# Patient Record
Sex: Female | Born: 2000 | Race: Black or African American | Hispanic: No | Marital: Single | Smoking: Former smoker
Health system: Southern US, Community
[De-identification: ages and names within clinical notes are randomized; demographics above are authoritative.]

## PROBLEM LIST (undated history)

## (undated) DIAGNOSIS — J020 Streptococcal pharyngitis: Secondary | ICD-10-CM

## (undated) DIAGNOSIS — F419 Anxiety disorder, unspecified: Secondary | ICD-10-CM

## (undated) DIAGNOSIS — T7840XA Allergy, unspecified, initial encounter: Secondary | ICD-10-CM

## (undated) DIAGNOSIS — F3481 Disruptive mood dysregulation disorder: Secondary | ICD-10-CM

## (undated) DIAGNOSIS — F1220 Cannabis dependence, uncomplicated: Secondary | ICD-10-CM

## (undated) DIAGNOSIS — F319 Bipolar disorder, unspecified: Secondary | ICD-10-CM

---
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
## 2018-02-02 ENCOUNTER — Encounter (HOSPITAL_COMMUNITY): Payer: Self-pay

## 2018-02-02 ENCOUNTER — Other Ambulatory Visit: Payer: Self-pay

## 2018-02-02 ENCOUNTER — Emergency Department (HOSPITAL_COMMUNITY)
Admission: EM | Admit: 2018-02-02 | Discharge: 2018-02-02 | Disposition: A | Discharge status: Home / Self Care | Payer: Self-pay | Attending: Emergency Medicine | Admitting: Emergency Medicine

## 2018-02-02 DIAGNOSIS — Z79899 Other long term (current) drug therapy: Secondary | ICD-10-CM | POA: Insufficient documentation

## 2018-02-02 DIAGNOSIS — J45909 Unspecified asthma, uncomplicated: Secondary | ICD-10-CM | POA: Insufficient documentation

## 2018-02-02 DIAGNOSIS — Z3202 Encounter for pregnancy test, result negative: Secondary | ICD-10-CM | POA: Insufficient documentation

## 2018-02-02 DIAGNOSIS — N946 Dysmenorrhea, unspecified: Secondary | ICD-10-CM | POA: Insufficient documentation

## 2018-02-02 DIAGNOSIS — F17210 Nicotine dependence, cigarettes, uncomplicated: Secondary | ICD-10-CM | POA: Insufficient documentation

## 2018-02-02 LAB — URINALYSIS, ROUTINE W REFLEX MICROSCOPIC
BACTERIA UA: NONE SEEN
Bilirubin Urine: NEGATIVE
Glucose, UA: NEGATIVE mg/dL
Ketones, ur: 20 mg/dL — AB
Leukocytes, UA: NEGATIVE
Nitrite: NEGATIVE
PROTEIN: NEGATIVE mg/dL
Specific Gravity, Urine: 1.015 (ref 1.005–1.030)
pH: 5 (ref 5.0–8.0)

## 2018-02-02 LAB — PREGNANCY, URINE: PREG TEST UR: NEGATIVE

## 2018-02-02 MED ORDER — IBUPROFEN 400 MG PO TABS
400.0000 mg | ORAL_TABLET | Freq: Once | ORAL | Status: AC
  Administered 2018-02-02: 400 mg via ORAL
  Filled 2018-02-02: qty 1

## 2018-02-02 MED ORDER — ACETAMINOPHEN 500 MG PO TABS
1000.0000 mg | ORAL_TABLET | Freq: Once | ORAL | Status: AC
  Administered 2018-02-02: 1000 mg via ORAL
  Filled 2018-02-02: qty 2

## 2018-02-02 NOTE — Discharge Instructions (Signed)
You may take 600 mg of ibuprofen and 1000 mg of Tylenol every 6 hours as needed for pain.  If your symptoms worsen, you develop fever, persistent vomiting or have any concerns, return immediately to the emergency department.

## 2018-02-02 NOTE — ED Provider Notes (Signed)
[facility] EMERGENCY DEPARTMENT Provider Note   CSN: [id number] Arrival date & time: 02/02/18  1003     History   Chief Complaint Chief Complaint  Patient presents with  . Abdominal Cramping    HPI Brooke Kelly is a 17 y.o. female.  HPI Patient presents with lower abdominal cramping which radiates to her low back.  This started yesterday.  Also started having vaginal bleeding.  States this is the normal time for her to start her menstrual..  Her last one was 1 month ago.  States that her cramping pain feels like menstrual cramps but more intense in nature.  She denies any dysuria, frequency or urgency.  No other vaginal complaints.  Denies fever or chills.  No nausea, vomiting or diarrhea.  She has not tried taking anything prior to coming to the emergency department.  She has been using heating pad with minimal relief. Past Medical History:  Diagnosis Date  . Allergy   . Anxiety   . Asthma   . Bipolar and related disorder (HCC) 05/18/2016  . Cannabis use disorder, moderate, dependence (HCC) 05/18/2016  . DMDD (disruptive mood dysregulation disorder) (HCC) 05/18/2016  . Strep pharyngitis     Patient Active Problem List   Diagnosis Date Noted  . DMDD (disruptive mood dysregulation disorder) (HCC) 05/18/2016  . Cannabis use disorder, moderate, dependence (HCC) 05/18/2016    History reviewed. No pertinent surgical history.   OB History   None      Home Medications    Prior to Admission medications   Medication Sig Start Date End Date Taking? Authorizing Provider  acetaminophen (TYLENOL) 500 MG tablet Take 1,000 mg by mouth every 6 (six) hours as needed.    [provider]  albuterol (PROVENTIL HFA;VENTOLIN HFA) 108 (90 Base) MCG/ACT inhaler Inhale 2 puffs into the lungs every 6 (six) hours as needed for wheezing or shortness of breath. 06/07/16   Loren Racer, MD  ARIPiprazole (ABILIFY) 5 MG tablet Take 1 tablet (5 mg total) by mouth at bedtime. 05/24/16    Denzil Magnuson, NP  cyclobenzaprine (FLEXERIL) 5 MG tablet Take 1 tablet (5 mg total) by mouth 3 (three) times daily as needed for muscle spasms. 01/30/17   Khatri, Hina, PA-C  fluticasone (FLONASE) 50 MCG/ACT nasal spray Place 2 sprays into both nostrils daily. 06/07/16   Loren Racer, MD  hydrOXYzine (ATARAX/VISTARIL) 50 MG tablet Take 1 tablet (50 mg total) by mouth at bedtime as needed (insomnia). 05/24/16   Denzil Magnuson, NP  ibuprofen (ADVIL,MOTRIN) 600 MG tablet Take 1 tablet (600 mg total) by mouth 4 (four) times daily. 11/24/16   Ivery Quale, PA-C  lidocaine (LIDODERM) 5 % Place 1 patch onto the skin daily. Remove & Discard patch within 12 hours or as directed by MD 01/30/17   Dietrich Pates, PA-C  loratadine (CLARITIN) 10 MG tablet Take 1 tablet (10 mg total) by mouth daily. 06/07/16   Loren Racer, MD  naproxen (NAPROSYN) 500 MG tablet Take 1 tablet (500 mg total) by mouth 2 (two) times daily. 01/30/17   Dietrich Pates, PA-C    Family History Family History  Problem Relation Age of Onset  . Hypertension Mother     Social History Social History   Tobacco Use  . Smoking status: Current Every Day Smoker    Packs/day: 0.50    Types: Cigarettes  . Smokeless tobacco: Never Used  Substance Use Topics  . Alcohol use: No  . Drug use: Yes    Frequency:  1.0 times per week    Types: Marijuana     Allergies   Other   Review of Systems Review of Systems  Constitutional: Negative for chills and fever.  Gastrointestinal: Positive for abdominal pain. Negative for constipation, diarrhea, nausea and vomiting.  Genitourinary: Positive for pelvic pain and vaginal bleeding. Negative for dysuria, flank pain, frequency, hematuria and vaginal discharge.  Musculoskeletal: Positive for back pain and myalgias.  Skin: Negative for rash and wound.  Neurological: Negative for dizziness, weakness, light-headedness, numbness and headaches.  All other systems reviewed and are  negative.    Physical Exam Updated Vital Signs BP 140/84 (BP Location: Right Arm)   Pulse (!) 59   Temp 98.3 F (36.8 C)   Resp 12   Wt 70.3 kg   LMP 02/01/2018   SpO2 99%   BMI 25.79 kg/m   Physical Exam  Constitutional: She is oriented to person, place, and time. She appears well-developed and well-nourished.  HENT:  Head: Normocephalic and atraumatic.  Eyes: Pupils are equal, round, and reactive to light. EOM are normal.  Neck: Normal range of motion. Neck supple.  Cardiovascular: Normal rate and regular rhythm. Exam reveals no gallop and no friction rub.  No murmur heard. Pulmonary/Chest: Effort normal and breath sounds normal. No stridor. No respiratory distress. She has no wheezes. She has no rales. She exhibits no tenderness.  Abdominal: Soft. Bowel sounds are normal. She exhibits no mass. There is tenderness. There is no rebound and no guarding.  Mild suprapubic tenderness to palpation.  No rebound or guarding.  No obvious masses  Musculoskeletal: Normal range of motion. She exhibits no edema or tenderness.  No midline thoracic or lumbar tenderness.  No CVA tenderness.  Neurological: She is alert and oriented to person, place, and time.  Moving all extremities without focal deficit.  Sensation fully intact.  Skin: Skin is warm and dry. No rash noted. No erythema.  Psychiatric: She has a normal mood and affect. Her behavior is normal.  Nursing note and vitals reviewed.    ED Treatments / Results  Labs (all labs ordered are listed, but only abnormal results are displayed) Labs Reviewed  URINALYSIS, ROUTINE W REFLEX MICROSCOPIC - Abnormal; Notable for the following components:      Result Value   Hgb urine dipstick SMALL (*)    Ketones, ur 20 (*)    All other components within normal limits  PREGNANCY, URINE    EKG None  Radiology No results found.  Procedures Procedures (including critical care time)  Medications Ordered in ED Medications   acetaminophen (TYLENOL) tablet 1,000 mg (has no administration in time range)  ibuprofen (ADVIL,MOTRIN) tablet 400 mg (has no administration in time range)     Initial Impression / Assessment and Plan / ED Course  I have reviewed the triage vital signs and the nursing notes.  Pertinent labs & imaging results that were available during my care of the patient were reviewed by me and considered in my medical decision making (see chart for details).    Abdominal exam is benign.  UA and hCG are negative.  Will treat symptomatically.  Return precautions given.   Final Clinical Impressions(s) / ED Diagnoses   Final diagnoses:  Dysmenorrhea    ED Discharge Orders    None       Loren Racer, MD 02/02/18 1253

## 2018-02-02 NOTE — ED Triage Notes (Signed)
Pt reports that she started her period yesterday and is having painful cramps. Flow is heavy.pt has tried motrin without any relief

## 2018-03-12 ENCOUNTER — Encounter (HOSPITAL_COMMUNITY): Payer: Self-pay | Admitting: *Deleted

## 2018-03-12 ENCOUNTER — Other Ambulatory Visit: Payer: Self-pay

## 2018-03-12 ENCOUNTER — Emergency Department (HOSPITAL_COMMUNITY)
Admission: EM | Admit: 2018-03-12 | Discharge: 2018-03-12 | Disposition: A | Discharge status: Home / Self Care | Payer: Self-pay | Attending: Emergency Medicine | Admitting: Emergency Medicine

## 2018-03-12 DIAGNOSIS — R69 Illness, unspecified: Secondary | ICD-10-CM

## 2018-03-12 DIAGNOSIS — J45909 Unspecified asthma, uncomplicated: Secondary | ICD-10-CM | POA: Insufficient documentation

## 2018-03-12 DIAGNOSIS — F17210 Nicotine dependence, cigarettes, uncomplicated: Secondary | ICD-10-CM | POA: Insufficient documentation

## 2018-03-12 DIAGNOSIS — Z79899 Other long term (current) drug therapy: Secondary | ICD-10-CM | POA: Insufficient documentation

## 2018-03-12 DIAGNOSIS — J111 Influenza due to unidentified influenza virus with other respiratory manifestations: Secondary | ICD-10-CM | POA: Insufficient documentation

## 2018-03-12 MED ORDER — BENZONATATE 100 MG PO CAPS: 100.0000 mg | ORAL_CAPSULE | Freq: Three times a day (TID) | ORAL | 0 refills | Status: AC

## 2018-03-12 MED ORDER — IBUPROFEN 600 MG PO TABS: 600.0000 mg | ORAL_TABLET | Freq: Four times a day (QID) | ORAL | 0 refills | Status: AC | PRN

## 2018-03-12 NOTE — ED Provider Notes (Signed)
[facility] EMERGENCY DEPARTMENT Provider Note   CSN: [id number] Arrival date & time: 03/12/18  1501     History   Chief Complaint Chief Complaint  Patient presents with  . Cough    HPI Brooke Kelly is a 17 y.o. female.  The history is provided by the patient. No language interpreter was used.  Cough  This is a new problem. The current episode started more than 2 days ago. The problem occurs constantly. The problem has been gradually worsening. The cough is non-productive. The maximum temperature recorded prior to her arrival was 100 to 100.9 F. Pertinent negatives include no chest pain. She has tried nothing for the symptoms. The treatment provided no relief.    Past Medical History:  Diagnosis Date  . Allergy   . Anxiety   . Asthma   . Bipolar and related disorder (HCC) 05/18/2016  . Cannabis use disorder, moderate, dependence (HCC) 05/18/2016  . DMDD (disruptive mood dysregulation disorder) (HCC) 05/18/2016  . Strep pharyngitis     Patient Active Problem List   Diagnosis Date Noted  . DMDD (disruptive mood dysregulation disorder) (HCC) 05/18/2016  . Cannabis use disorder, moderate, dependence (HCC) 05/18/2016    History reviewed. No pertinent surgical history.   OB History   None      Home Medications    Prior to Admission medications   Medication Sig Start Date End Date Taking? Authorizing Provider  acetaminophen (TYLENOL) 500 MG tablet Take 1,000 mg by mouth every 6 (six) hours as needed.    [provider]  albuterol (PROVENTIL HFA;VENTOLIN HFA) 108 (90 Base) MCG/ACT inhaler Inhale 2 puffs into the lungs every 6 (six) hours as needed for wheezing or shortness of breath. 06/07/16   Loren RacerYelverton, David, MD  ARIPiprazole (ABILIFY) 5 MG tablet Take 1 tablet (5 mg total) by mouth at bedtime. 05/24/16   Denzil Magnusonhomas, Lashunda, NP  cyclobenzaprine (FLEXERIL) 5 MG tablet Take 1 tablet (5 mg total) by mouth 3 (three) times daily as needed for muscle spasms. 01/30/17    Khatri, Hina, PA-C  fluticasone (FLONASE) 50 MCG/ACT nasal spray Place 2 sprays into both nostrils daily. 06/07/16   Loren RacerYelverton, David, MD  hydrOXYzine (ATARAX/VISTARIL) 50 MG tablet Take 1 tablet (50 mg total) by mouth at bedtime as needed (insomnia). 05/24/16   Denzil Magnusonhomas, Lashunda, NP  ibuprofen (ADVIL,MOTRIN) 600 MG tablet Take 1 tablet (600 mg total) by mouth 4 (four) times daily. 11/24/16   Ivery QualeBryant, Hobson, PA-C  lidocaine (LIDODERM) 5 % Place 1 patch onto the skin daily. Remove & Discard patch within 12 hours or as directed by MD 01/30/17   Dietrich PatesKhatri, Hina, PA-C  loratadine (CLARITIN) 10 MG tablet Take 1 tablet (10 mg total) by mouth daily. 06/07/16   Loren RacerYelverton, David, MD  naproxen (NAPROSYN) 500 MG tablet Take 1 tablet (500 mg total) by mouth 2 (two) times daily. 01/30/17   Dietrich PatesKhatri, Hina, PA-C    Family History Family History  Problem Relation Age of Onset  . Hypertension Mother     Social History Social History   Tobacco Use  . Smoking status: Current Every Day Smoker    Packs/day: 0.50    Types: Cigarettes  . Smokeless tobacco: Never Used  Substance Use Topics  . Alcohol use: Yes  . Drug use: Yes    Frequency: 1.0 times per week    Types: Marijuana     Allergies   Other   Review of Systems Review of Systems  Respiratory: Positive for cough.  Cardiovascular: Negative for chest pain.  All other systems reviewed and are negative.    Physical Exam Updated Vital Signs BP (!) 146/71 (BP Location: Right Arm)   Pulse 83   Temp 98.9 F (37.2 C) (Oral)   Resp 15   Ht 5\' 5"  (1.651 m)   Wt 70.3 kg   LMP 03/08/2018   SpO2 99%   BMI 25.79 kg/m   Physical Exam  Constitutional: She appears well-developed and well-nourished.  HENT:  Head: Normocephalic.  Right Ear: External ear normal.  Left Ear: External ear normal.  Nose: Nose normal.  Mouth/Throat: Oropharynx is clear and moist.  Eyes: Pupils are equal, round, and reactive to light.  Neck: Normal range of motion.    Cardiovascular: Normal rate.  Pulmonary/Chest: Effort normal.  Abdominal: Soft.  Musculoskeletal: Normal range of motion.  Neurological: She is alert.  Skin: Skin is warm.  Psychiatric: She has a normal mood and affect.  Nursing note and vitals reviewed.    ED Treatments / Results  Labs (all labs ordered are listed, but only abnormal results are displayed) Labs Reviewed - No data to display  EKG None  Radiology No results found.  Procedures Procedures (including critical care time)  Medications Ordered in ED Medications - No data to display   Initial Impression / Assessment and Plan / ED Course  I have reviewed the triage vital signs and the nursing notes.  Pertinent labs & imaging results that were available during my care of the patient were reviewed by me and considered in my medical decision making (see chart for details).     Pt has a normal exam.  Symptoms sound like influenza.  Pt give rx for ibuprofen 600mg  and tessalon perles  Final Clinical Impressions(s) / ED Diagnoses   Final diagnoses:  Influenza-like illness    ED Discharge Orders         Ordered    ibuprofen (ADVIL,MOTRIN) 600 MG tablet  Every 6 hours PRN     03/12/18 1646    benzonatate (TESSALON) 100 MG capsule  Every 8 hours     03/12/18 1646        An After Visit Summary was printed and given to the patient.    Elson Areas, [place] 03/12/18 1647    Vanetta Mulders, MD 03/13/18 1537

## 2018-03-12 NOTE — ED Triage Notes (Signed)
Cough, congestion, fever per patient, body aches for two days.

## 2018-03-12 NOTE — Discharge Instructions (Signed)
Return if any problems.

## 2018-08-01 ENCOUNTER — Encounter (HOSPITAL_COMMUNITY): Payer: Self-pay | Admitting: Emergency Medicine

## 2018-08-01 ENCOUNTER — Other Ambulatory Visit: Payer: Self-pay

## 2018-08-01 ENCOUNTER — Emergency Department (HOSPITAL_COMMUNITY): Payer: No Typology Code available for payment source

## 2018-08-01 ENCOUNTER — Emergency Department (HOSPITAL_COMMUNITY)
Admission: EM | Admit: 2018-08-01 | Discharge: 2018-08-01 | Disposition: A | Discharge status: Home / Self Care | Payer: No Typology Code available for payment source | Attending: Emergency Medicine | Admitting: Emergency Medicine

## 2018-08-01 DIAGNOSIS — J45909 Unspecified asthma, uncomplicated: Secondary | ICD-10-CM | POA: Diagnosis not present

## 2018-08-01 DIAGNOSIS — M545 Low back pain, unspecified: Secondary | ICD-10-CM

## 2018-08-01 DIAGNOSIS — Y929 Unspecified place or not applicable: Secondary | ICD-10-CM | POA: Diagnosis not present

## 2018-08-01 DIAGNOSIS — S0990XA Unspecified injury of head, initial encounter: Secondary | ICD-10-CM

## 2018-08-01 DIAGNOSIS — Z79899 Other long term (current) drug therapy: Secondary | ICD-10-CM | POA: Diagnosis not present

## 2018-08-01 DIAGNOSIS — Z87891 Personal history of nicotine dependence: Secondary | ICD-10-CM | POA: Diagnosis not present

## 2018-08-01 DIAGNOSIS — Y999 Unspecified external cause status: Secondary | ICD-10-CM | POA: Insufficient documentation

## 2018-08-01 DIAGNOSIS — Y939 Activity, unspecified: Secondary | ICD-10-CM | POA: Insufficient documentation

## 2018-08-01 LAB — POC URINE PREG, ED: Preg Test, Ur: NEGATIVE

## 2018-08-01 NOTE — ED Notes (Signed)
ED Provider at bedside. 

## 2018-08-01 NOTE — Discharge Instructions (Addendum)
X-rays were completed normal.  You may be sore for several days.  Tylenol for pain.

## 2018-08-01 NOTE — ED Notes (Signed)
Patient transported to X-ray 
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
